# Patient Record
Sex: Male | Born: 1985 | Race: Black or African American | Hispanic: No | Marital: Single | Smoking: Light tobacco smoker
Health system: Southern US, Community
[De-identification: ages and names within clinical notes are randomized; demographics above are authoritative.]

## PROBLEM LIST (undated history)

## (undated) DIAGNOSIS — I517 Cardiomegaly: Secondary | ICD-10-CM

## (undated) DIAGNOSIS — Z720 Tobacco use: Secondary | ICD-10-CM

## (undated) DIAGNOSIS — F1290 Cannabis use, unspecified, uncomplicated: Secondary | ICD-10-CM

## (undated) HISTORY — DX: Cardiomegaly: I51.7

## (undated) HISTORY — DX: Tobacco use: Z72.0

## (undated) HISTORY — DX: Cannabis use, unspecified, uncomplicated: F12.90

---

## 2014-08-03 DIAGNOSIS — B80 Enterobiasis: Secondary | ICD-10-CM | POA: Diagnosis not present

## 2014-08-05 DIAGNOSIS — B356 Tinea cruris: Secondary | ICD-10-CM | POA: Diagnosis not present

## 2014-08-05 DIAGNOSIS — R300 Dysuria: Secondary | ICD-10-CM | POA: Diagnosis not present

## 2015-05-29 ENCOUNTER — Encounter: Payer: Self-pay | Admitting: Family Medicine

## 2015-05-29 ENCOUNTER — Ambulatory Visit (INDEPENDENT_AMBULATORY_CARE_PROVIDER_SITE_OTHER): Payer: Medicare Other | Admitting: Family Medicine

## 2015-05-29 VITALS — BP 132/68 | HR 55 | Temp 97.8°F | Resp 16 | Ht 73.0 in | Wt 194.0 lb

## 2015-05-29 DIAGNOSIS — F17200 Nicotine dependence, unspecified, uncomplicated: Secondary | ICD-10-CM | POA: Diagnosis not present

## 2015-05-29 DIAGNOSIS — Z23 Encounter for immunization: Secondary | ICD-10-CM | POA: Diagnosis not present

## 2015-05-29 DIAGNOSIS — E012 Iodine-deficiency related (endemic) goiter, unspecified: Secondary | ICD-10-CM

## 2015-05-29 DIAGNOSIS — E040 Nontoxic diffuse goiter: Secondary | ICD-10-CM | POA: Insufficient documentation

## 2015-05-29 DIAGNOSIS — F1290 Cannabis use, unspecified, uncomplicated: Secondary | ICD-10-CM

## 2015-05-29 DIAGNOSIS — F1210 Cannabis abuse, uncomplicated: Secondary | ICD-10-CM | POA: Diagnosis not present

## 2015-05-29 LAB — TSH: TSH: 1.11 mIU/L (ref 0.40–4.50)

## 2015-05-29 NOTE — Progress Notes (Signed)
Subjective:    Patient ID: Quentez Lober, male    DOB: Jun 28, 1985, 30 y.o.   MRN: [id number]  HPI  Mr. Kiet Geer, a 30 year old male presents to establish care. He states that he was a patient at the [facility] and was last seen for evaluation 2 years. Ago. He states that he exercises 3-4 times per weeks, eats a healthy diet and drinks 1 gallon of water per day. He does monthly self testicular exams and has not been sexually active over the past 3 years. He says that he had STD testing recently and all tests were negative.   He is a chronic everyday smoker. He has been smoking cigarettes for 20 years. He also uses marijuana several times per week. He says that he is not interested in quitting at this time.  Past Medical History  Diagnosis Date  . Enlarged heart   . Tobacco use   . Marijuana use    Social History   Social History  . Marital Status: Single    Spouse Name: N/A  . Number of Children: N/A  . Years of Education: N/A   Occupational History  . Not on file.   Social History Main Topics  . Smoking status: Light Tobacco Smoker    Types: Cigarettes  . Smokeless tobacco: Not on file  . Alcohol Use: 2.4 oz/week    2 Shots of liquor, 2 Cans of beer per week     Comment: weekly   . Drug Use: Yes    Special: Marijuana     Comment: occ.   Marland Kitchen Sexual Activity: Not on file   Other Topics Concern  . Not on file   Social History Narrative  . No narrative on file   No Known Allergies Review of Systems  Constitutional: Negative.  Negative for fever and unexpected weight change.  HENT: Negative.  Negative for dental problem and drooling.   Eyes: Negative.   Respiratory: Negative.  Negative for cough.   Cardiovascular: Negative.  Negative for chest pain, palpitations and leg swelling.  Gastrointestinal: Negative.   Endocrine: Negative.  Negative for polydipsia, polyphagia and polyuria.  Genitourinary: Negative.  Negative for urgency, decreased urine  volume and testicular pain.  Musculoskeletal: Negative.  Negative for myalgias, neck pain and neck stiffness.  Allergic/Immunologic: Negative.   Neurological: Negative.  Negative for tremors, seizures, syncope and speech difficulty.  Hematological: Negative.   Psychiatric/Behavioral: Negative.  Negative for sleep disturbance. The patient is not nervous/anxious.       Objective:   Physical Exam  Constitutional: He is oriented to person, place, and time.  HENT:  Head: Normocephalic and atraumatic.  Right Ear: External ear normal.  Left Ear: External ear normal.  Nose: Nose normal.  Mouth/Throat: Oropharynx is clear and moist.  Gage inserted in left ear 10 years ago.  Left ear lobe pendulous   Eyes: Conjunctivae and EOM are normal. Pupils are equal, round, and reactive to light.  Neck: Normal range of motion. Neck supple. No rigidity. No edema, no erythema and normal range of motion present. Thyromegaly present. No thyroid mass present.  Cardiovascular: Normal rate, regular rhythm, normal heart sounds and intact distal pulses.   Pulmonary/Chest: Effort normal and breath sounds normal.  Abdominal: Soft. Bowel sounds are normal.  Musculoskeletal: Normal range of motion.  Neurological: He is alert and oriented to person, place, and time. He has normal reflexes.  Skin: Skin is warm and dry.  Psychiatric: He has a normal mood  and affect. His behavior is normal. Judgment and thought content normal.     BP 132/68 mmHg  Pulse 55  Temp(Src) 97.8 F (36.6 C) (Oral)  Resp 16  Ht 6\' 1"  (1.854 m)  Wt 194 lb (87.998 kg)  BMI 25.60 kg/m2  SpO2 100% Assessment & Plan:  1. Goiter, simple Patient reports weight loss. He denies heart palpitations, constipation, diarrhea, skin changes, hair changes, intolerence to heat or cold. Will check TSH on today.  - TSH  2. Tobacco dependence Smoking cessation instruction/counseling given:  counseled patient on the dangers of tobacco use, advised patient  to stop smoking, and reviewed strategies to maximize success   3. Marijuana use Patient encouraged to discontinue marijuana. He says that he is not interested in quitting at this time.   4. Immunization due  - Pneumococcal polysaccharide vaccine 23-valent greater than or equal to 2yo subcutaneous/IM  Routine Health Maintenance: Recommend a lowfat, low carbohydrate diet divided over 5-6 small meals, increase water intake to 6-8 glasses, and 150 minutes per week of cardiovascular exercise.  Continue monthly self-testicular exams Use barrier protection with sexual intercourse   RTC: Follow up in September for an influenza vaccination   Massie MaroonHollis,Kriss Perleberg M, FNP

## 2015-05-29 NOTE — Patient Instructions (Signed)
Tobacco Use Disorder Tobacco use disorder (TUD) is a mental disorder. It is the long-term use of tobacco in spite of related health problems or difficulty with normal life activities. Tobacco is most commonly smoked as cigarettes and less commonly as cigars or pipes. Smokeless chewing tobacco and snuff are also popular. People with TUD get a feeling of extreme pleasure (euphoria) from using tobacco and have a desire to use it again and again. Repeated use of tobacco can cause problems. The addictive effects of tobacco are due mainly tothe ingredient nicotine. Nicotine also causes a rush of adrenaline (epinephrine) in the body. This leads to increased blood pressure, heart rate, and breathing rate. These changes may cause problems for people with high blood pressure, weak hearts, or lung disease. High doses of nicotine in children and pets can lead to seizures and death.  Tobacco contains a number of other unsafe chemicals. These chemicals are especially harmful when inhaled as smoke and can damage almost every organ in the body. Smokers live shorter lives than nonsmokers and are at risk of dying from a number of diseases and cancers. Tobacco smoke can also cause health problems for nonsmokers (due to inhaling secondhand smoke). Smoking is also a fire hazard.  TUD usually starts in the late teenage years and is most common in young adults between the ages of 18 and 25 years. People who start smoking earlier in life are more likely to continue smoking as adults. TUD is somewhat more common in men than women. People with TUD are at higher risk for using alcohol and other drugs of abuse. RISK FACTORS Risk factors for TUD include:   Having family members with the disorder.  Being around people who use tobacco.  Having an existing mental health issue such as schizophrenia, depression, bipolar disorder, ADHD, or posttraumatic stress disorder (PTSD). SIGNS AND SYMPTOMS  People with tobacco use disorder have  two or more of the following signs and symptoms within 12 months:   Use of more tobacco over a longer period than intended.   Not able to cut down or control tobacco use.   A lot of time spent obtaining or using tobacco.   Strong desire or urge to use tobacco (craving). Cravings may last for 6 months or longer after quitting.  Use of tobacco even when use leads to major problems at work, school, or home.   Use of tobacco even when use leads to relationship problems.   Giving up or cutting down on important life activities because of tobacco use.   Repeatedly using tobacco in situations where it puts you or others in physical danger, like smoking in bed.   Use of tobacco even when it is known that a physical or mental problem is likely related to tobacco use.   Physical problems are numerous and may include chronic bronchitis, emphysema, lung and other cancers, gum disease, high blood pressure, heart disease, and stroke.   Mental problems caused by tobacco may include difficulty sleeping and anxiety.  Need to use greater amounts of tobacco to get the same effect. This means you have developed a tolerance.   Withdrawal symptoms as a result of stopping or rapidly cutting back use. These symptoms may last a month or more after quitting and include the following:   Depressed, anxious, or irritable mood.   Difficulty concentrating.   Increased appetite.  Restlessness or trouble sleeping.   Use of tobacco to avoid withdrawal symptoms. DIAGNOSIS  Tobacco use disorder is diagnosed by   your health care provider. A diagnosis may be made by:  Your health care provider asking questions about your tobacco use and any problems it may be causing.  A physical exam.  Lab tests.  You may be referred to a mental health professional or addiction specialist. The severity of tobacco use disorder depends on the number of signs and symptoms you have:   Mild--Two or three  symptoms.  Moderate--Four or five symptoms.   Severe--Six or more symptoms.  TREATMENT  Many people with tobacco use disorder are unable to quit on their own and need help. Treatment options include the following:  Nicotine replacement therapy (NRT). NRT provides nicotine without the other harmful chemicals in tobacco. NRT gradually lowers the dosage of nicotine in the body and reduces withdrawal symptoms. NRT is available in over-the-counter forms (gum, lozenges, and skin patches) as well as prescription forms (mouth inhaler and nasal spray).  Medicines.This may include:  Antidepressant medicine that may reduce nicotine cravings.  A medicine that acts on nicotine receptors in the brain to reduce cravings and withdrawal symptoms. It may also block the effects of tobacco in people with TUD who relapse.  Counseling or talk therapy. A form of talk therapy called behavioral therapy is commonly used to treat people with TUD. Behavioral therapy looks at triggers for tobacco use, how to avoid them, and how to cope with cravings. It is most effective in person or by phone but is also available in self-help forms (books and Internet websites).  Support groups. These provide emotional support, advice, and guidance for quitting tobacco. The most effective treatment for TUD is usually a combination of medicine, talk therapy, and support groups. HOME CARE INSTRUCTIONS  Keep all follow-up visits as directed by your health care provider. This is important.  Take medicines only as directed by your health care provider.  Check with your health care provider before starting new prescription or over-the-counter medicines. SEEK MEDICAL CARE IF:  You are not able to take your medicines as prescribed.  Treatment is not helping your TUD and your symptoms get worse. SEEK IMMEDIATE MEDICAL CARE IF:  You have serious thoughts about hurting yourself or others.  You have trouble breathing, chest pain,  sudden weakness, or sudden numbness in part of your body.   This information is not intended to replace advice given to you by your health care provider. Make sure you discuss any questions you have with your health care provider.   Document Released: 08/28/2003 Document Revised: 01/12/2014 Document Reviewed: 02/17/2013 Elsevier Interactive Patient Education 2016 Elsevier Inc.  

## 2017-11-12 ENCOUNTER — Emergency Department (HOSPITAL_COMMUNITY)
Admission: EM | Admit: 2017-11-12 | Discharge: 2017-11-12 | Disposition: A | Discharge status: Home / Self Care | Payer: Medicare Other | Attending: Emergency Medicine | Admitting: Emergency Medicine

## 2017-11-12 ENCOUNTER — Encounter (HOSPITAL_COMMUNITY): Payer: Self-pay | Admitting: Emergency Medicine

## 2017-11-12 ENCOUNTER — Emergency Department (HOSPITAL_COMMUNITY): Payer: Medicare Other

## 2017-11-12 ENCOUNTER — Other Ambulatory Visit: Payer: Self-pay

## 2017-11-12 DIAGNOSIS — I82401 Acute embolism and thrombosis of unspecified deep veins of right lower extremity: Secondary | ICD-10-CM | POA: Diagnosis not present

## 2017-11-12 DIAGNOSIS — F17210 Nicotine dependence, cigarettes, uncomplicated: Secondary | ICD-10-CM | POA: Diagnosis not present

## 2017-11-12 DIAGNOSIS — M7989 Other specified soft tissue disorders: Secondary | ICD-10-CM | POA: Diagnosis not present

## 2017-11-12 DIAGNOSIS — I82441 Acute embolism and thrombosis of right tibial vein: Secondary | ICD-10-CM | POA: Diagnosis not present

## 2017-11-12 DIAGNOSIS — I82411 Acute embolism and thrombosis of right femoral vein: Secondary | ICD-10-CM | POA: Diagnosis not present

## 2017-11-12 DIAGNOSIS — M79604 Pain in right leg: Secondary | ICD-10-CM | POA: Diagnosis present

## 2017-11-12 DIAGNOSIS — I82431 Acute embolism and thrombosis of right popliteal vein: Secondary | ICD-10-CM | POA: Diagnosis not present

## 2017-11-12 LAB — CBC WITH DIFFERENTIAL/PLATELET
ABS IMMATURE GRANULOCYTES: 0.04 10*3/uL (ref 0.00–0.07)
Basophils Absolute: 0 10*3/uL (ref 0.0–0.1)
Basophils Relative: 0 %
EOS PCT: 1 %
Eosinophils Absolute: 0.1 10*3/uL (ref 0.0–0.5)
HCT: 37.7 % — ABNORMAL LOW (ref 39.0–52.0)
HEMOGLOBIN: 11.1 g/dL — AB (ref 13.0–17.0)
IMMATURE GRANULOCYTES: 0 %
LYMPHS ABS: 1.3 10*3/uL (ref 0.7–4.0)
LYMPHS PCT: 11 %
MCH: 24.6 pg — AB (ref 26.0–34.0)
MCHC: 29.4 g/dL — ABNORMAL LOW (ref 30.0–36.0)
MCV: 83.4 fL (ref 80.0–100.0)
Monocytes Absolute: 1.1 10*3/uL — ABNORMAL HIGH (ref 0.1–1.0)
Monocytes Relative: 10 %
NEUTROS ABS: 8.7 10*3/uL — AB (ref 1.7–7.7)
NRBC: 0 % (ref 0.0–0.2)
Neutrophils Relative %: 78 %
PLATELETS: 440 10*3/uL — AB (ref 150–400)
RBC: 4.52 MIL/uL (ref 4.22–5.81)
RDW: 11.9 % (ref 11.5–15.5)
WBC: 11.2 10*3/uL — ABNORMAL HIGH (ref 4.0–10.5)

## 2017-11-12 LAB — BASIC METABOLIC PANEL
Anion gap: 11 (ref 5–15)
BUN: 15 mg/dL (ref 6–20)
CHLORIDE: 102 mmol/L (ref 98–111)
CO2: 25 mmol/L (ref 22–32)
CREATININE: 1.01 mg/dL (ref 0.61–1.24)
Calcium: 9.6 mg/dL (ref 8.9–10.3)
GFR calc Af Amer: >60 mL/min (ref >60)
GFR calc non Af Amer: >60 mL/min (ref >60)
Glucose, Bld: 86 mg/dL (ref 70–99)
POTASSIUM: 4.7 mmol/L (ref 3.5–5.1)
Sodium: 138 mmol/L (ref 135–145)

## 2017-11-12 LAB — PROTIME-INR
INR: 1.04
Prothrombin Time: 13.5 seconds (ref 11.4–15.2)

## 2017-11-12 MED ORDER — APIXABAN 5 MG PO TABS: 10.0000 mg | ORAL_TABLET | Freq: Two times a day (BID) | ORAL | 2 refills | Status: AC

## 2017-11-12 MED ORDER — APIXABAN 5 MG PO TABS: 5.0000 mg | ORAL_TABLET | Freq: Two times a day (BID) | ORAL | Status: DC

## 2017-11-12 MED ORDER — APIXABAN 5 MG PO TABS
10.0000 mg | ORAL_TABLET | Freq: Two times a day (BID) | ORAL | Status: DC
  Administered 2017-11-12: 10 mg via ORAL
  Filled 2017-11-12: qty 2

## 2017-11-12 NOTE — Discharge Instructions (Signed)
You were evaluated in the emergency department for 3 weeks of right leg pain.  Your ultrasound showed that you have a blood clot in the vein in that leg.  We are prescribing you a blood thinning medication to help improve this.  It will be important that you get a primary care doctor so this can be followed.  We are giving you a list of doctors in the area.  Please return if any chest pain shortness of breath or other concerns.

## 2017-11-12 NOTE — Progress Notes (Signed)
ANTICOAGULATION CONSULT NOTE - Initial Consult  Pharmacy Consult for Eliquis Indication: DVT  No Known Allergies  Patient Measurements: Height: 6\' 1"  (185.4 cm) Weight: 203 lb (92.1 kg) IBW/kg (Calculated) : 79.9  Vital Signs: Temp: 98.2 F (36.8 C) (11/08 1051) Temp Source: Oral (11/08 1051) BP: 134/76 (11/08 1051) Pulse Rate: 69 (11/08 1051)  Labs: Recent Labs    11/12/17 1235  HGB 11.1*  HCT 37.7*  PLT 440*  LABPROT 13.5  INR 1.04  CREATININE 1.01    Estimated Creatinine Clearance: 116.5 mL/min (by C-G formula based on SCr of 1.01 mg/dL).   Medical History: Past Medical History:  Diagnosis Date  . Enlarged heart   . Marijuana use   . Tobacco use     Medications:  No home meds  Assessment: 32 y.o. male.  He is presenting with 3 weeks of right leg pain.  Said it started in his calf and now also involves his medial thigh.  It is painful to palpation and increased pain with ambulation.  He does not recall any trauma. Vascular Ultrasound shows right lower extremity is positive for proximal acute DVT involving the length of the femoral vein, the popliteal vein, and extending distally into the tibial veins. Pharmacy asked to start oral anticoagulation  Goal of Therapy:  Monitor platelets by anticoagulation protocol: Yes   Plan:  Eliquis 10mg  po BID for 7 days, then 5mg  po BID Educate on eliquis Monitor for S/S of bleeding  Elder Cyphers, BS Loura Back, BCPS Clinical Pharmacist Pager [phone] 11/12/2017,2:03 PM

## 2017-11-12 NOTE — ED Provider Notes (Signed)
[facility] EMERGENCY DEPARTMENT Provider Note   CSN: [id number] Arrival date & time: 11/12/17  1023     History   Chief Complaint Chief Complaint  Patient presents with  . Leg Pain    HPI Edward Stevens is a 32 y.o. male.  He is presenting with 3 weeks of right leg pain.  Said it started in his calf and now also involves his medial thigh.  It is painful to palpation and increased pain with ambulation.  He does not recall any trauma.  He has been trying Excedrin without relief.  No fevers no chills no shortness of breath no chest pain.  No DVT risk factors.  The history is provided by the patient.  Leg Pain   This is a new problem. Episode onset: 3 weeks. The problem occurs constantly. The problem has been gradually worsening. The pain is present in the right upper leg and right lower leg. The quality of the pain is described as sharp. The pain is moderate. Pertinent negatives include no numbness. The symptoms are aggravated by activity. He has tried OTC pain medications for the symptoms. The treatment provided mild relief. There has been no history of extremity trauma. Family history is significant for no gout.    Past Medical History:  Diagnosis Date  . Enlarged heart   . Marijuana use   . Tobacco use     Patient Active Problem List   Diagnosis Date Noted  . Tobacco dependence 05/29/2015  . Marijuana use 05/29/2015  . Goiter, simple 05/29/2015    History reviewed. No pertinent surgical history.      Home Medications    Prior to Admission medications   Not on File    Family History Family History  Problem Relation Age of Onset  . Heart disease Mother     Social History Social History   Tobacco Use  . Smoking status: Light Tobacco Smoker    Types: Cigarettes  . Smokeless tobacco: Never Used  Substance Use Topics  . Alcohol use: Yes    Alcohol/week: 4.0 standard drinks    Types: 2 Cans of beer, 2 Shots of liquor per week    Comment: weekly   .  Drug use: Not Currently    Types: Marijuana    Comment: occ.      Allergies   Patient has no known allergies.   Review of Systems Review of Systems  Constitutional: Negative for fever.  HENT: Negative for sore throat.   Eyes: Negative for visual disturbance.  Respiratory: Negative for shortness of breath.   Cardiovascular: Negative for chest pain.  Gastrointestinal: Negative for abdominal pain.  Genitourinary: Negative for dysuria.  Musculoskeletal: Negative for back pain and joint swelling.  Skin: Negative for rash and wound.  Neurological: Negative for numbness.     Physical Exam Updated Vital Signs BP 134/76 (BP Location: Left Arm)   Pulse 69   Temp 98.2 F (36.8 C) (Oral)   Resp 16   Ht 6\' 1"  (1.854 m)   Wt 92.1 kg   SpO2 99%   BMI 26.78 kg/m   Physical Exam  Constitutional: He is oriented to person, place, and time. He appears well-developed and well-nourished.  HENT:  Head: Normocephalic and atraumatic.  Eyes: Conjunctivae are normal.  Neck: Neck supple.  Cardiovascular: Normal rate, regular rhythm and normal heart sounds.  Pulmonary/Chest: Effort normal. He has no wheezes.  Abdominal: Soft. There is no tenderness. There is no guarding.  Musculoskeletal: He exhibits tenderness. He  exhibits no edema or deformity.  Tender through his medial thigh on the right in the right calf.  Full range of motion of hip and knee without any limitations.  No edema noted.  Distal pulses sensation cap refill intact.  Neurological: He is alert and oriented to person, place, and time. GCS eye subscore is 4. GCS verbal subscore is 5. GCS motor subscore is 6.  Skin: Skin is warm and dry. Capillary refill takes less than 2 seconds.  Psychiatric: He has a normal mood and affect.  Nursing note and vitals reviewed.    ED Treatments / Results  Labs (all labs ordered are listed, but only abnormal results are displayed) Labs Reviewed  CBC WITH DIFFERENTIAL/PLATELET - Abnormal;  Notable for the following components:      Result Value   WBC 11.2 (*)    Hemoglobin 11.1 (*)    HCT 37.7 (*)    MCH 24.6 (*)    MCHC 29.4 (*)    Platelets 440 (*)    Neutro Abs 8.7 (*)    Monocytes Absolute 1.1 (*)    All other components within normal limits  BASIC METABOLIC PANEL  PROTIME-INR    EKG None  Radiology US Venous Img Lower Right (dvt Study)  Result Date: 11/12/2017 CLINICAL DATA:  32 year old male with a history of right thigh pain for 3 weeks EXAM: RIGHT LOWER EXTREMITY VENOUS DOPPLER ULTRASOUND TECHNIQUE: Gray-scale sonography with graded compression, as well as color Doppler and duplex ultrasound were performed to evaluate the lower extremity deep venous systems from the level of the common femoral vein and including the common femoral, femoral, profunda femoral, popliteal and calf veins including the posterior tibial, peroneal and gastrocnemius veins when visible. The superficial great saphenous vein was also interrogated. Spectral Doppler was utilized to evaluate flow at rest and with distal augmentation maneuvers in the common femoral, femoral and popliteal veins. COMPARISON:  None. FINDINGS: Contralateral Common Femoral Vein: Respiratory phasicity is normal and symmetric with the symptomatic side. No evidence of thrombus. Normal compressibility. Common Femoral Vein: No evidence of thrombus. Normal compressibility, respiratory phasicity and response to augmentation. Saphenofemoral Junction: No evidence of thrombus. Normal compressibility and flow on color Doppler imaging. Profunda Femoral Vein: No evidence of thrombus. Normal compressibility and flow on color Doppler imaging. Noncompressible femoral vein, from the proximal femoral vein through the distal femoral vein and popliteal vein into the tibial veins. No flow. Superficial Great Saphenous Vein: No evidence of thrombus. Normal compressibility and flow on color Doppler imaging. Other Findings:  None. IMPRESSION:  Sonographic survey of the right lower extremity is positive for proximal acute DVT involving the length of the femoral vein, the popliteal vein, and extending distally into the tibial veins. Signed, Yvone Neu. Reyne Dumas, RPVI [facility] Electronically Signed   By: Gilmer Mor D.O.   On: 11/12/2017 12:19    Procedures Procedures (including critical care time)  Medications Ordered in ED Medications  apixaban (ELIQUIS) tablet 10 mg (10 mg Oral Given 11/12/17 1442)    Followed by  apixaban (ELIQUIS) tablet 5 mg (has no administration in time range)     Initial Impression / Assessment and Plan / ED Course  I have reviewed the triage vital signs and the nursing notes.  Pertinent labs & imaging results that were available during my care of the patient were reviewed by me and considered in my medical decision making (see chart for details).  Clinical Course as of Nov 12 1717  Fri Nov 12, 2017  1228 Patient's ultrasound study is positive for acute DVT.  I have ordered some screening labs and will update the patient on results.   [MB]  1314 Placed a consult into pharmacy for recommendations on anticoagulation.   [MB]    Clinical Course User Index [MB] Terrilee Files, MD      Final Clinical Impressions(s) / ED Diagnoses   Final diagnoses:  Leg DVT (deep venous thromboembolism), acute, right [facility])    ED Discharge Orders         Ordered    apixaban (ELIQUIS) 5 MG TABS tablet  2 times daily     11/12/17 1421           Terrilee Files, MD 11/12/17 1720

## 2017-11-12 NOTE — Care Management (Signed)
Eliquis 39m. 5 mg twice a day co-pay for 30day supply is: $3.80.  Xarelto 20 mg  daily  for 30 day supply is: $3.80.  Preferred Pharmacies are : [facility] and [facility]  No.PA required Pt. has not met his deductible of $300.00

## 2017-11-12 NOTE — ED Triage Notes (Signed)
Patient complaining of of right leg pain x 3 weeks. States pain started in his right calf and has "traveled up into my upper leg."

## 2017-12-23 DIAGNOSIS — Z7689 Persons encountering health services in other specified circumstances: Secondary | ICD-10-CM | POA: Diagnosis not present

## 2017-12-23 DIAGNOSIS — I82401 Acute embolism and thrombosis of unspecified deep veins of right lower extremity: Secondary | ICD-10-CM | POA: Diagnosis not present

## 2020-02-05 ENCOUNTER — Other Ambulatory Visit (HOSPITAL_COMMUNITY): Payer: Self-pay | Admitting: Internal Medicine

## 2020-02-05 ENCOUNTER — Other Ambulatory Visit: Payer: Self-pay | Admitting: Internal Medicine

## 2020-02-05 DIAGNOSIS — M79604 Pain in right leg: Secondary | ICD-10-CM

## 2020-02-05 DIAGNOSIS — I82401 Acute embolism and thrombosis of unspecified deep veins of right lower extremity: Secondary | ICD-10-CM

## 2020-03-23 IMAGING — US RIGHT LOWER EXTREMITY VENOUS ULTRASOUND
1 series · 13 of 24 positions shown · non-contrast
Comparison: None.

CLINICAL DATA: 32-year-old male with a history of right thigh pain
for 3 weeks



[Series 1: right lower extremity venous ultrasound · 0.08mm/px · 13 of 38 slices shown]
[im 1/38]
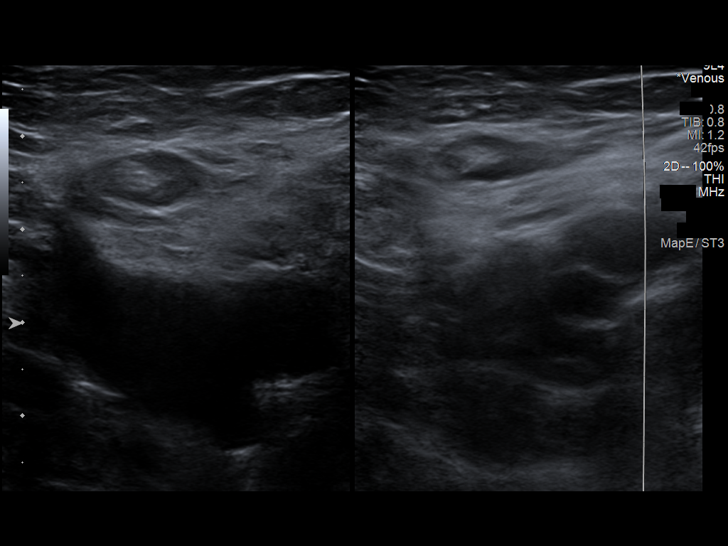
[im 4/38]
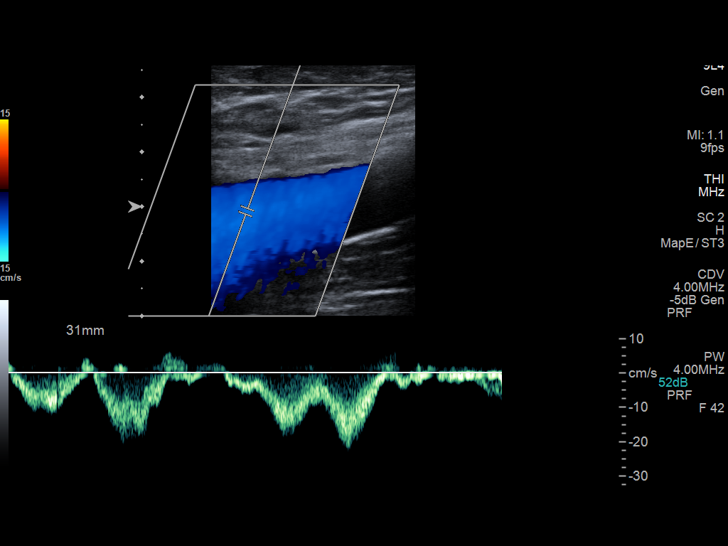
[im 7/38]
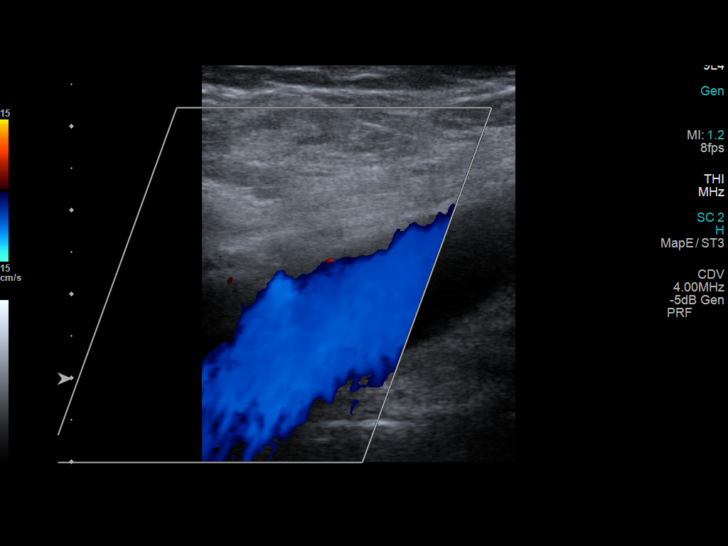
[im 10/38]
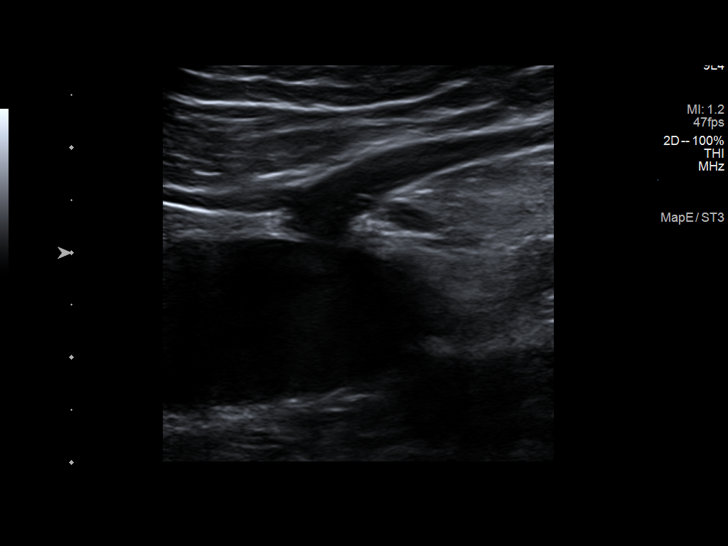
[im 13/38]
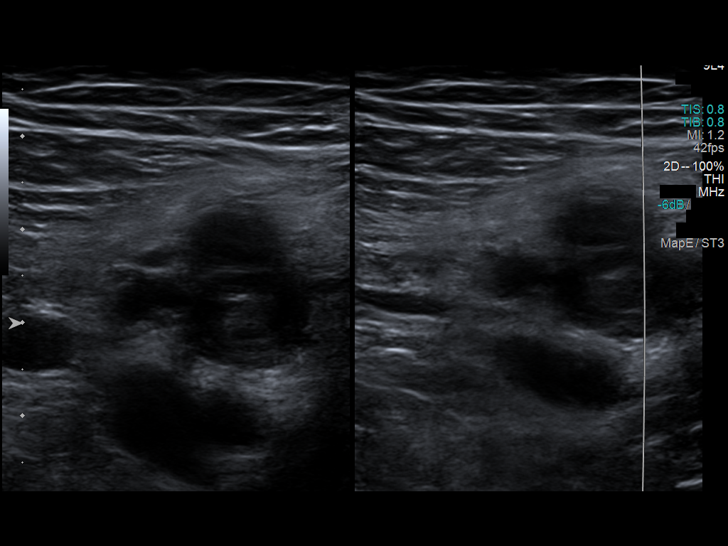
[im 17/38]
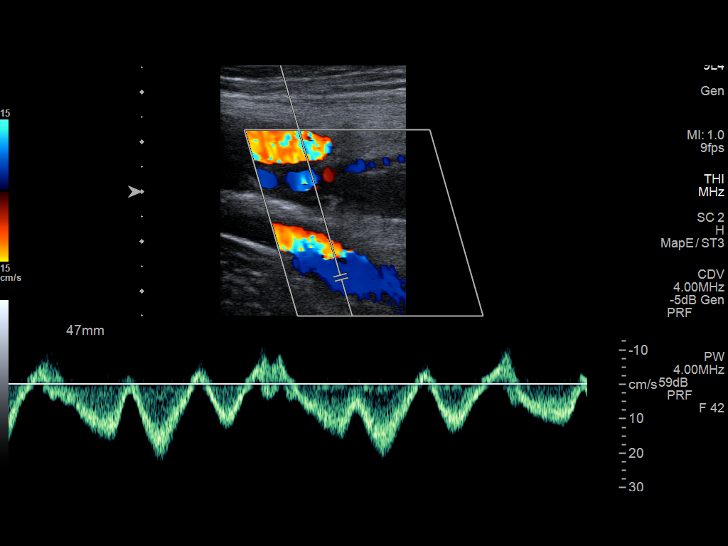
[im 20/38]
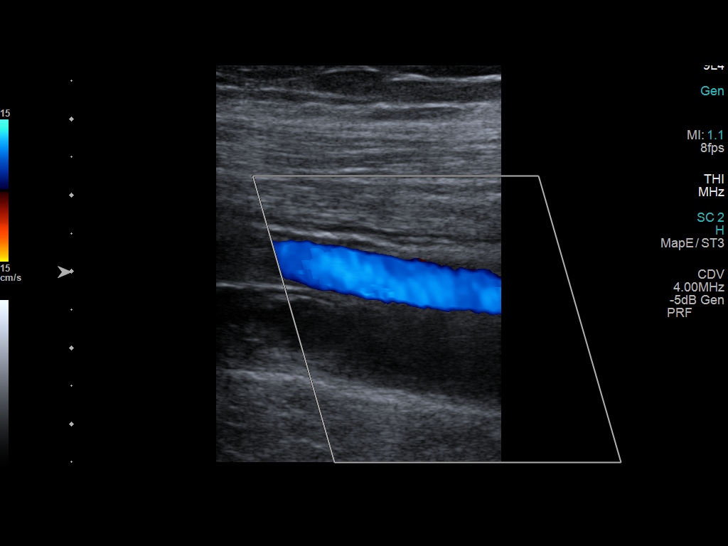
[im 21/38]
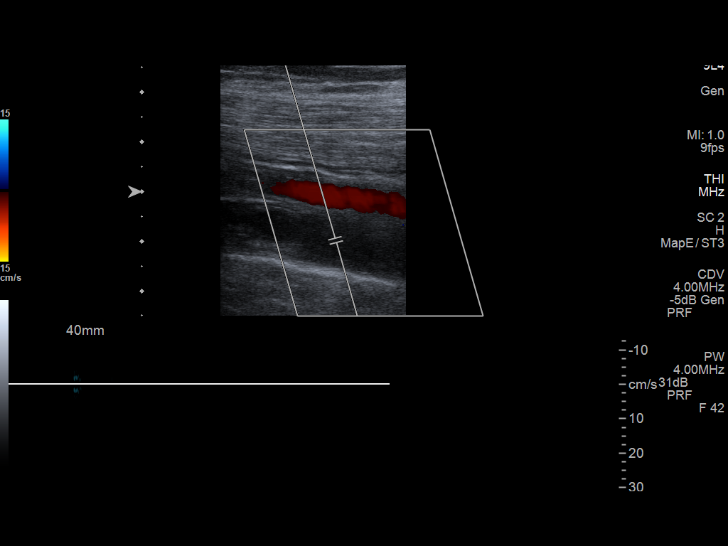
[im 25/38]
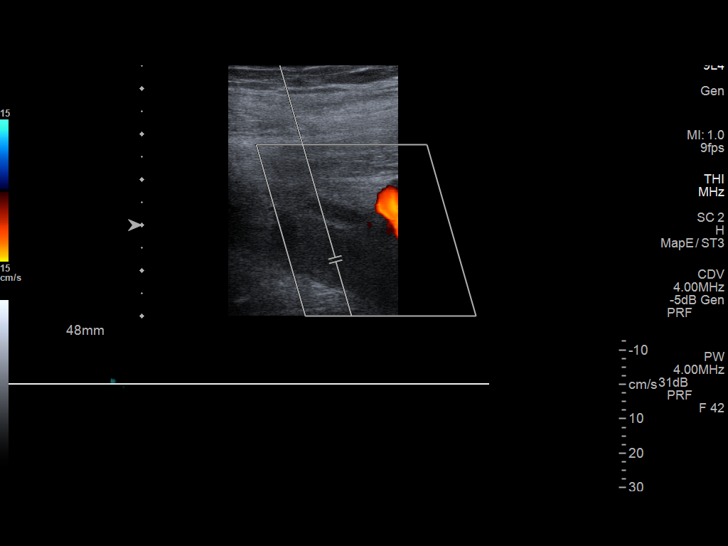
[im 28/38]
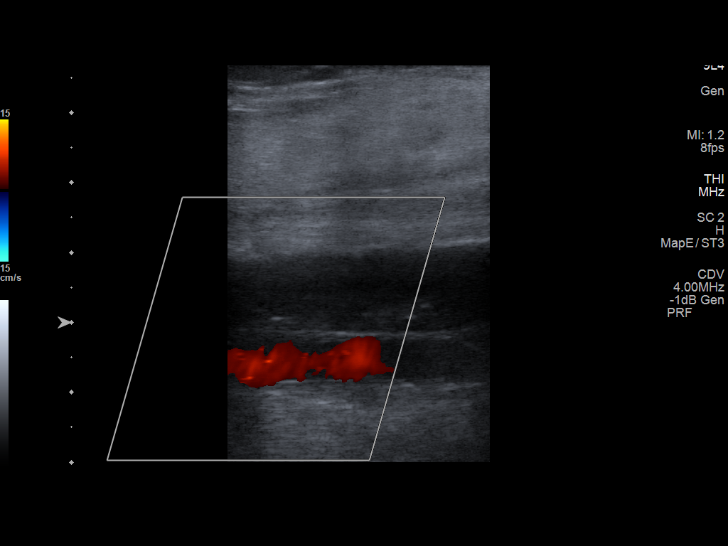
[im 31/38]
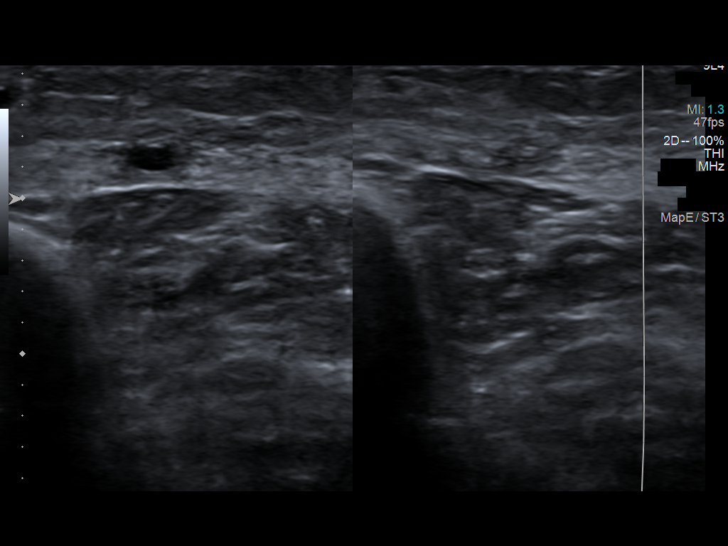
[im 34/38]
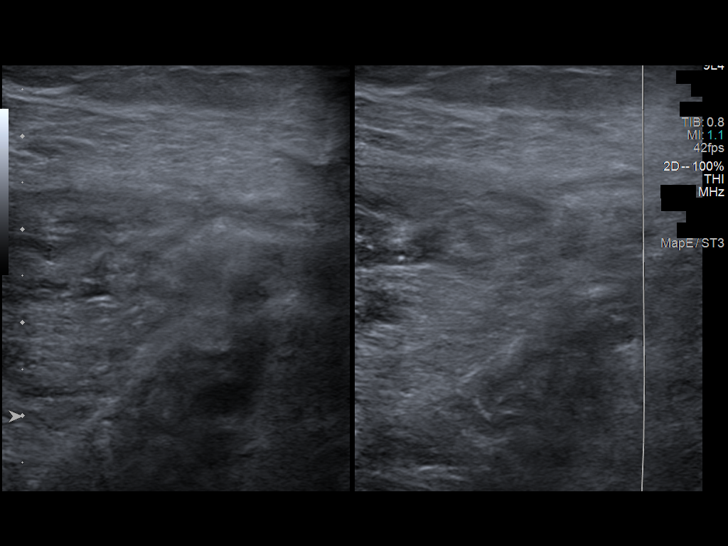
[im 38/38]
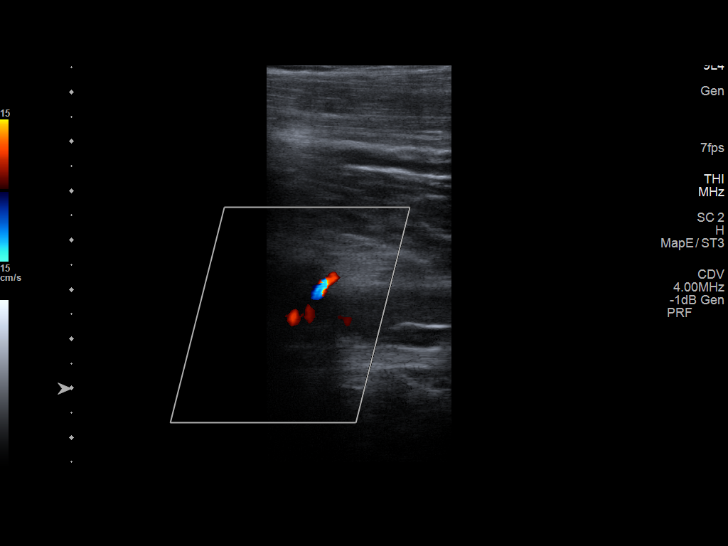

[13 of 24 positions shown; findings below may reference images not displayed]

FINDINGS: Contralateral Common Femoral Vein: Respiratory phasicity is normal
and symmetric with the symptomatic side. No evidence of thrombus.
Normal compressibility.

Common Femoral Vein: No evidence of thrombus. Normal
compressibility, respiratory phasicity and response to augmentation.

Saphenofemoral Junction: No evidence of thrombus. Normal
compressibility and flow on color Doppler imaging.

Profunda Femoral Vein: No evidence of thrombus. Normal
compressibility and flow on color Doppler imaging.

Noncompressible femoral vein, from the proximal femoral vein through
the distal femoral vein and popliteal vein into the tibial veins. No
flow.

Superficial Great Saphenous Vein: No evidence of thrombus. Normal
compressibility and flow on color Doppler imaging.

Other Findings:  None.
IMPRESSION: Sonographic survey of the right lower extremity is positive for
proximal acute DVT involving the length of the femoral vein, the
popliteal vein, and extending distally into the tibial veins.
